# Patient Record
Sex: Male | Born: 1970 | Race: White | Hispanic: No | Marital: Single | Smoking: Current every day smoker
Health system: Southern US, Community
[De-identification: ages and names within clinical notes are randomized; demographics above are authoritative.]

## PROBLEM LIST (undated history)

## (undated) DIAGNOSIS — E7800 Pure hypercholesterolemia, unspecified: Secondary | ICD-10-CM

## (undated) DIAGNOSIS — R569 Unspecified convulsions: Secondary | ICD-10-CM

## (undated) DIAGNOSIS — N200 Calculus of kidney: Secondary | ICD-10-CM

## (undated) DIAGNOSIS — I10 Essential (primary) hypertension: Secondary | ICD-10-CM

---

## 2021-04-10 ENCOUNTER — Encounter (HOSPITAL_COMMUNITY): Payer: Self-pay

## 2021-04-10 ENCOUNTER — Emergency Department (HOSPITAL_COMMUNITY)

## 2021-04-10 ENCOUNTER — Emergency Department (HOSPITAL_COMMUNITY)
Admission: EM | Admit: 2021-04-10 | Discharge: 2021-04-10 | Disposition: A | Discharge status: Home / Self Care | Attending: Emergency Medicine | Admitting: Emergency Medicine

## 2021-04-10 DIAGNOSIS — Z79899 Other long term (current) drug therapy: Secondary | ICD-10-CM | POA: Diagnosis not present

## 2021-04-10 DIAGNOSIS — R569 Unspecified convulsions: Secondary | ICD-10-CM | POA: Diagnosis present

## 2021-04-10 DIAGNOSIS — F17210 Nicotine dependence, cigarettes, uncomplicated: Secondary | ICD-10-CM | POA: Insufficient documentation

## 2021-04-10 DIAGNOSIS — I10 Essential (primary) hypertension: Secondary | ICD-10-CM | POA: Diagnosis not present

## 2021-04-10 HISTORY — DX: Unspecified convulsions: R56.9

## 2021-04-10 HISTORY — DX: Essential (primary) hypertension: I10

## 2021-04-10 HISTORY — DX: Calculus of kidney: N20.0

## 2021-04-10 LAB — COMPREHENSIVE METABOLIC PANEL
ALT: 40 U/L (ref 0–44)
AST: 24 U/L (ref 15–41)
Albumin: 4.1 g/dL (ref 3.5–5.0)
Alkaline Phosphatase: 79 U/L (ref 38–126)
Anion gap: 6 (ref 5–15)
BUN: 25 mg/dL — ABNORMAL HIGH (ref 6–20)
CO2: 25 mmol/L (ref 22–32)
Calcium: 9.2 mg/dL (ref 8.9–10.3)
Chloride: 107 mmol/L (ref 98–111)
Creatinine, Ser: 0.9 mg/dL (ref 0.61–1.24)
GFR, Estimated: >60 mL/min (ref >60)
Glucose, Bld: 91 mg/dL (ref 70–99)
Potassium: 4 mmol/L (ref 3.5–5.1)
Sodium: 138 mmol/L (ref 135–145)
Total Bilirubin: 0.6 mg/dL (ref 0.3–1.2)
Total Protein: 7.1 g/dL (ref 6.5–8.1)

## 2021-04-10 LAB — CBC WITH DIFFERENTIAL/PLATELET
Abs Immature Granulocytes: 0.03 10*3/uL (ref 0.00–0.07)
Basophils Absolute: 0 10*3/uL (ref 0.0–0.1)
Basophils Relative: 0 %
Eosinophils Absolute: 0.2 10*3/uL (ref 0.0–0.5)
Eosinophils Relative: 2 %
HCT: 45.3 % (ref 39.0–52.0)
Hemoglobin: 15.1 g/dL (ref 13.0–17.0)
Immature Granulocytes: 0 %
Lymphocytes Relative: 24 %
Lymphs Abs: 2 10*3/uL (ref 0.7–4.0)
MCH: 30 pg (ref 26.0–34.0)
MCHC: 33.3 g/dL (ref 30.0–36.0)
MCV: 89.9 fL (ref 80.0–100.0)
Monocytes Absolute: 0.5 10*3/uL (ref 0.1–1.0)
Monocytes Relative: 6 %
Neutro Abs: 5.7 10*3/uL (ref 1.7–7.7)
Neutrophils Relative %: 68 %
Platelets: 290 10*3/uL (ref 150–400)
RBC: 5.04 MIL/uL (ref 4.22–5.81)
RDW: 12.3 % (ref 11.5–15.5)
WBC: 8.4 10*3/uL (ref 4.0–10.5)
nRBC: 0 % (ref 0.0–0.2)

## 2021-04-10 LAB — CBG MONITORING, ED: Glucose-Capillary: 101 mg/dL — ABNORMAL HIGH (ref 70–99)

## 2021-04-10 MED ORDER — ACETAMINOPHEN 325 MG PO TABS
650.0000 mg | ORAL_TABLET | Freq: Once | ORAL | Status: AC
  Administered 2021-04-10: 650 mg via ORAL
  Filled 2021-04-10: qty 2

## 2021-04-10 MED ORDER — LEVETIRACETAM IN NACL 1000 MG/100ML IV SOLN
1000.0000 mg | Freq: Once | INTRAVENOUS | Status: AC
  Administered 2021-04-10: 1000 mg via INTRAVENOUS
  Filled 2021-04-10: qty 100

## 2021-04-10 NOTE — Discharge Instructions (Addendum)
Continue taking the Keppra 750 mg twice a day.  Follow-up with Dr. Gerilyn Pilgrim in the next 2 to 3 weeks.  Return if any more problems

## 2021-04-10 NOTE — ED Provider Notes (Signed)
[facility] EMERGENCY DEPARTMENT Provider Note   CSN: [id number] Arrival date & time: 04/10/21  1215     History Chief Complaint  Patient presents with   Seizures    Leslie Langille is a 50 y.o. male.  Patient states he has a history of seizures but has not been on his medicine since February.  Patient is incarcerated in a prison [place] here.  Patient had a seizure yesterday and they started him back on his Keppra 750 twice a day.  Then he had another seizure today.  Patient feels back to his normal now  The history is provided by the patient, medical records and the police. No language interpreter was used.  Seizures Seizure activity on arrival: yes   Seizure type:  Grand mal Preceding symptoms: no sensation of an aura present   Initial focality:  None Episode characteristics: no abnormal movements   Postictal symptoms: confusion   Return to baseline: yes   Severity:  Moderate Timing:  Once Progression:  Resolved Context: not alcohol withdrawal       Past Medical History:  Diagnosis Date   Hypertension    Kidney stones    Seizures (HCC)     There are no problems to display for this patient.   History reviewed. No pertinent surgical history.     Family History  Problem Relation Age of Onset   Diabetes Mother    Cancer Mother    Cancer Father     Social History   Tobacco Use   Smoking status: Every Day    Packs/day: 1.00    Years: 30.00    Pack years: 30.00    Types: Cigarettes   Smokeless tobacco: Never  Vaping Use   Vaping Use: Never used  Substance Use Topics   Alcohol use: Not Currently   Drug use: Not Currently    Home Medications Prior to Admission medications   Medication Sig Start Date End Date Taking? Authorizing Provider  levETIRAcetam (KEPPRA) 500 MG tablet Take 500 mg by mouth 2 (two) times daily. 04/09/21   [provider]    Allergies    Dilantin [phenytoin] and Naproxen  Review of Systems   Review of Systems   Constitutional:  Negative for appetite change and fatigue.  HENT:  Negative for congestion, ear discharge and sinus pressure.   Eyes:  Negative for discharge.  Respiratory:  Negative for cough.   Cardiovascular:  Negative for chest pain.  Gastrointestinal:  Negative for abdominal pain and diarrhea.  Genitourinary:  Negative for frequency and hematuria.  Musculoskeletal:  Negative for back pain.  Skin:  Negative for rash.  Neurological:  Positive for seizures. Negative for headaches.  Psychiatric/Behavioral:  Negative for hallucinations.    Physical Exam Updated Vital Signs BP 124/83   Pulse (!) 57   Temp 97.9 F (36.6 C) (Oral)   Resp 19   Ht 5\' 8"  (1.727 m)   Wt 77.1 kg   SpO2 97%   BMI 25.85 kg/m   Physical Exam Vitals and nursing note reviewed.  Constitutional:      Appearance: He is well-developed.  HENT:     Head: Normocephalic.     Nose: Nose normal.  Eyes:     General: No scleral icterus.    Conjunctiva/sclera: Conjunctivae normal.  Neck:     Thyroid: No thyromegaly.  Cardiovascular:     Rate and Rhythm: Normal rate and regular rhythm.     Heart sounds: No murmur heard.   No friction  rub. No gallop.  Pulmonary:     Breath sounds: No stridor. No wheezing or rales.  Chest:     Chest wall: No tenderness.  Abdominal:     General: There is no distension.     Tenderness: There is no abdominal tenderness. There is no rebound.  Musculoskeletal:        General: Normal range of motion.     Cervical back: Neck supple.  Lymphadenopathy:     Cervical: No cervical adenopathy.  Skin:    Findings: No erythema or rash.  Neurological:     Mental Status: He is alert and oriented to person, place, and time.     Motor: No abnormal muscle tone.     Coordination: Coordination normal.  Psychiatric:        Behavior: Behavior normal.    ED Results / Procedures / Treatments   Labs (all labs ordered are listed, but only abnormal results are displayed) Labs Reviewed   COMPREHENSIVE METABOLIC PANEL - Abnormal; Notable for the following components:      Result Value   BUN 25 (*)    All other components within normal limits  CBG MONITORING, ED - Abnormal; Notable for the following components:   Glucose-Capillary 101 (*)    All other components within normal limits  CBC WITH DIFFERENTIAL/PLATELET    EKG None  Radiology CT HEAD WO CONTRAST ( )  Result Date: 04/10/2021 CLINICAL DATA:  Seizure weakness EXAM: CT HEAD WITHOUT CONTRAST TECHNIQUE: Contiguous axial images were obtained from the base of the skull through the vertex without intravenous contrast. COMPARISON:  None. FINDINGS: Brain: There is no evidence of acute intracranial hemorrhage, extra-axial fluid collection, or infarct. The ventricles are not enlarged. There is no midline shift. No mass lesion is identified. Vascular: No hyperdense vessel or unexpected calcification. Skull: Normal. Negative for fracture or focal lesion. Sinuses/Orbits: There is mild mucosal thickening in the imaged paranasal sinuses. The globes and orbits are unremarkable. Other: The mastoid air cells are clear. IMPRESSION: No acute intracranial pathology or epileptogenic focus identified. Electronically Signed   By: Lesia Hausen M.D.   On: 04/10/2021 14:26    Procedures Procedures   Medications Ordered in ED Medications  levETIRAcetam (KEPPRA) IVPB 1000 mg/100 mL premix (0 mg Intravenous Stopped 04/10/21 1315)  acetaminophen (TYLENOL) tablet 650 mg (650 mg Oral Given 04/10/21 1441)    ED Course  I have reviewed the triage vital signs and the nursing notes.  Pertinent labs & imaging results that were available during my care of the patient were reviewed by me and considered in my medical decision making (see chart for details).    MDM Rules/Calculators/A&P                           Patient's labs and CT are unremarkable.  Patient with epilepsy.  He will be given IV load of Keppra 1 g and will follow back up with Dr.  Gerilyn Pilgrim.  I spoke with neurology and they did not feel any further work-up was needed right now.  They also agreed with the gram of Keppra and 750 of Keppra twice a day Final Clinical Impression(s) / ED Diagnoses Final diagnoses:  Seizure [facility])    Rx / DC Orders ED Discharge Orders     None        Bethann Berkshire, MD 04/11/21 [phone]

## 2021-04-10 NOTE — ED Triage Notes (Signed)
Pt. States they had a seizure last night and was given Keppra last night. Pt. Also took Keppra today but pt. Stated they had another seizure around 10:45 this morning.

## 2021-04-10 NOTE — ED Notes (Signed)
Pt bought in by jail. They are at pt bedside

## 2021-04-10 NOTE — ED Notes (Signed)
Water given to officers at bedside

## 2021-04-10 NOTE — Plan of Care (Signed)
Received a call from Dr. Estell Harpin regarding this patient.  Reports that the patient had a seizure in the prison yesterday, was started on Keppra 750 twice daily, had another seizure and brought into ER for evaluation.  Currently back to baseline.  Known history of seizures but was off of medications for multiple months-possibly due to being incarcerated.  Unknown if he has a neurologist. Question was if he needs additional antiepileptics.  Based on limited information and no examination-a load of Keppra 1 g IV right now followed by continuing Keppra 750 twice daily should suffice if that has worked for him in the past.  He should see an outpatient neurologist as soon as possible to establish care and management of seizures.  Also outpatient records should be obtained if he has prior notes from neurologists elsewhere.  -- Milon Dikes, MD Neurologist [facility] Pager: [phone]

## 2021-08-05 ENCOUNTER — Emergency Department (HOSPITAL_COMMUNITY)
Admission: EM | Admit: 2021-08-05 | Discharge: 2021-08-05 | Disposition: A | Discharge status: Home / Self Care | Attending: Emergency Medicine | Admitting: Emergency Medicine

## 2021-08-05 ENCOUNTER — Other Ambulatory Visit: Payer: Self-pay

## 2021-08-05 DIAGNOSIS — I10 Essential (primary) hypertension: Secondary | ICD-10-CM | POA: Diagnosis not present

## 2021-08-05 DIAGNOSIS — R569 Unspecified convulsions: Secondary | ICD-10-CM

## 2021-08-05 DIAGNOSIS — G40909 Epilepsy, unspecified, not intractable, without status epilepticus: Secondary | ICD-10-CM | POA: Diagnosis not present

## 2021-08-05 DIAGNOSIS — F17210 Nicotine dependence, cigarettes, uncomplicated: Secondary | ICD-10-CM | POA: Diagnosis not present

## 2021-08-05 NOTE — ED Triage Notes (Signed)
Pt BIB EMS coming from [facility], was dcd from [facility] yesterday and has been having seizures since yesterday while at [facility]. Per officer at the bedside who was also with the patient yesterday, provider at [facility] stated "nothing's wrong with his brain, he needs to see a psychiatrist/therapist". Had 2 seizures today, lasting 30 sec each. Seizure like activity noted by EMS en route, non postictal per EMS, Versed 2.5mg  given.   135/92 HR 83 99% RA CBG 122

## 2021-08-05 NOTE — ED Notes (Signed)
Pt started shaking, witnessed by this RN lasted about 5 seconds. Pt then sit up straight quickly, asking for paper towel as he started coughing, non postictal.

## 2021-08-05 NOTE — ED Notes (Signed)
Nurse Pickeral given report. States that she will be sending patient back to ER every time he has a seizure.

## 2021-08-05 NOTE — Discharge Instructions (Addendum)
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  The seizures are not because of an electrical storm of the brain as an epilepsy.  Therefore the management of these episodes include providing attention to physiologic and psychogenic causes.  Please read the instructions provided.  We recommend that Joe Collins sees his outpatient neurologist and outpatient psychiatrist. If [facility] did not give him referral to a neurologist, then please follow-up with Dr. Gerilyn Pilgrim, neurologist with [facility] in [place].  If there are repeat episodes, ensure that there is no harmful objects around the store [employer] that could cause injury.  Also make sure that he is breathing well and not turning blue.

## 2021-08-05 NOTE — ED Provider Notes (Signed)
[facility] EMERGENCY DEPARTMENT Provider Note   CSN: [id number] Arrival date & time: 08/05/21  1018     History Chief Complaint  Patient presents with   Seizures    Joe Collins is a 50 y.o. male.  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  Patient comes with the present.  He was just admitted to Dr Joe Collins for similar symptoms earlier this week.  According to the prison guard, patient had 2 episodes of nonepileptic seizures that they witnessed.  Patient is finger start twitching, followed by eyes rolling back and then generalized tonic-clonic activity.  There is no prolonged postictal phase.  No incontinence.  Patient has been getting his medication as prescribed.  He denies any recent illnesses and denies any substance use.  He does not think he is under extra stress.  Past Medical History:  Diagnosis Date   Hypertension    Kidney stones    Seizures (HCC)     There are no problems to display for this patient.   No past surgical history on file.     Family History  Problem Relation Age of Onset   Diabetes Mother    Cancer Mother    Cancer Father     Social History   Tobacco Use   Smoking status: Every Day    Packs/day: 1.00    Years: 30.00    Pack years: 30.00    Types: Cigarettes   Smokeless tobacco: Never  Vaping Use   Vaping Use: Never used  Substance Use Topics   Alcohol use: Not Currently   Drug use: Not Currently    Home Medications Prior to Admission medications   Medication Sig Start Date End Date Taking? Authorizing Provider  levETIRAcetam (KEPPRA) 500 MG tablet Take 1,500 mg by mouth 2 (two) times daily. 04/09/21  Yes [provider]    Allergies    Dilantin [phenytoin], Naproxen, and Valproic acid  Review of Systems   Review of Systems  Constitutional:  Positive for activity change.  Eyes:  Negative for visual disturbance.  Respiratory:   Negative for shortness of breath.   Cardiovascular:  Negative for chest pain.  Gastrointestinal:  Negative for nausea and vomiting.  Allergic/Immunologic: Negative for immunocompromised state.  Neurological:  Positive for seizures. Negative for headaches.  Hematological:  Does not bruise/bleed easily.  All other systems reviewed and are negative.  Physical Exam Updated Vital Signs BP 119/83    Pulse 73    Temp 98.2 F (36.8 C) (Oral)    Resp 10    Ht 5\' 8"  (1.727 m)    Wt 77.1 kg    SpO2 99%    BMI 25.85 kg/m   Physical Exam Vitals and nursing note reviewed.  Constitutional:      Appearance: He is well-developed.  HENT:     Head: Atraumatic.  Eyes:     Extraocular Movements: Extraocular movements intact.     Pupils: Pupils are equal, round, and reactive to light.  Cardiovascular:     Rate and Rhythm: Normal rate.  Pulmonary:     Effort: Pulmonary effort is normal.  Musculoskeletal:     Cervical back: Neck supple.  Skin:    General: Skin is warm.  Neurological:     Mental Status: He is alert and oriented to person, place, and time.    ED Results / Procedures / Treatments   Labs (all labs ordered are listed, but  only abnormal results are displayed) Labs Reviewed - No data to display  EKG None  Radiology No results found.  Procedures Procedures   Medications Ordered in ED Medications - No data to display  ED Course  I have reviewed the triage vital signs and the nursing notes.  Pertinent labs & imaging results that were available during my care of the patient were reviewed by me and considered in my medical decision making (see chart for details).    MDM Rules/Calculators/A&P  50 year old comes in with chief complaint of seizure-like activity.  I reviewed patient's records, and it appears that he is having nonepileptic seizures.   We have observed patient in the ED for 4 hours and there has not been any episodes witnessed after the initial 1 at 10:40 AM.   The nursing staff witnessed the episode, and it appeared like a nonorganic episode to them since there was no postictal phase or any incontinence.  No labs indicated.  3:03 PM The patient appears reasonably screened and/or stabilized for discharge and I doubt any other medical condition or other [facility] requiring further screening, evaluation, or treatment in the ED at this time prior to discharge.   Results from the ER workup discussed with the patient face to face and all questions answered to the best of my ability. The patient is safe for discharge with strict return precautions.      Final Clinical Impression(s) / ED Diagnoses Final diagnoses:  Nonepileptic episode [facility])    Rx / DC Orders ED Discharge Orders     None        Derwood Kaplan, MD 08/05/21 1503

## 2021-11-20 ENCOUNTER — Encounter (HOSPITAL_COMMUNITY): Payer: Self-pay

## 2021-11-20 ENCOUNTER — Other Ambulatory Visit: Payer: Self-pay

## 2021-11-20 ENCOUNTER — Emergency Department (HOSPITAL_COMMUNITY)

## 2021-11-20 ENCOUNTER — Emergency Department (HOSPITAL_COMMUNITY)
Admission: EM | Admit: 2021-11-20 | Discharge: 2021-11-20 | Disposition: A | Discharge status: Home / Self Care | Source: Home / Self Care | Attending: Emergency Medicine | Admitting: Emergency Medicine

## 2021-11-20 ENCOUNTER — Emergency Department (HOSPITAL_COMMUNITY)
Admission: EM | Admit: 2021-11-20 | Discharge: 2021-11-20 | Disposition: A | Discharge status: Home / Self Care | Attending: Emergency Medicine | Admitting: Emergency Medicine

## 2021-11-20 ENCOUNTER — Encounter (HOSPITAL_COMMUNITY): Payer: Self-pay | Admitting: *Deleted

## 2021-11-20 DIAGNOSIS — M5030 Other cervical disc degeneration, unspecified cervical region: Secondary | ICD-10-CM | POA: Diagnosis not present

## 2021-11-20 DIAGNOSIS — R569 Unspecified convulsions: Secondary | ICD-10-CM | POA: Diagnosis present

## 2021-11-20 DIAGNOSIS — R519 Headache, unspecified: Secondary | ICD-10-CM | POA: Diagnosis not present

## 2021-11-20 DIAGNOSIS — R030 Elevated blood-pressure reading, without diagnosis of hypertension: Secondary | ICD-10-CM | POA: Insufficient documentation

## 2021-11-20 DIAGNOSIS — M791 Myalgia, unspecified site: Secondary | ICD-10-CM | POA: Diagnosis not present

## 2021-11-20 DIAGNOSIS — I10 Essential (primary) hypertension: Secondary | ICD-10-CM | POA: Diagnosis not present

## 2021-11-20 DIAGNOSIS — F445 Conversion disorder with seizures or convulsions: Secondary | ICD-10-CM | POA: Insufficient documentation

## 2021-11-20 DIAGNOSIS — Z79899 Other long term (current) drug therapy: Secondary | ICD-10-CM | POA: Insufficient documentation

## 2021-11-20 DIAGNOSIS — G40909 Epilepsy, unspecified, not intractable, without status epilepticus: Secondary | ICD-10-CM

## 2021-11-20 HISTORY — DX: Pure hypercholesterolemia, unspecified: E78.00

## 2021-11-20 LAB — URINALYSIS, ROUTINE W REFLEX MICROSCOPIC
Bacteria, UA: NONE SEEN
Bilirubin Urine: NEGATIVE
Glucose, UA: NEGATIVE mg/dL
Ketones, ur: NEGATIVE mg/dL
Leukocytes,Ua: NEGATIVE
Nitrite: NEGATIVE
Protein, ur: NEGATIVE mg/dL
Specific Gravity, Urine: 1.008 (ref 1.005–1.030)
pH: 7 (ref 5.0–8.0)

## 2021-11-20 LAB — COMPREHENSIVE METABOLIC PANEL
ALT: 36 U/L (ref 0–44)
AST: 20 U/L (ref 15–41)
Albumin: 4.4 g/dL (ref 3.5–5.0)
Alkaline Phosphatase: 75 U/L (ref 38–126)
Anion gap: 10 (ref 5–15)
BUN: 16 mg/dL (ref 6–20)
CO2: 24 mmol/L (ref 22–32)
Calcium: 9.4 mg/dL (ref 8.9–10.3)
Chloride: 105 mmol/L (ref 98–111)
Creatinine, Ser: 0.87 mg/dL (ref 0.61–1.24)
GFR, Estimated: >60 mL/min (ref >60)
Glucose, Bld: 100 mg/dL — ABNORMAL HIGH (ref 70–99)
Potassium: 4.1 mmol/L (ref 3.5–5.1)
Sodium: 139 mmol/L (ref 135–145)
Total Bilirubin: 0.5 mg/dL (ref 0.3–1.2)
Total Protein: 7.9 g/dL (ref 6.5–8.1)

## 2021-11-20 LAB — CBC WITH DIFFERENTIAL/PLATELET
Abs Immature Granulocytes: 0.02 10*3/uL (ref 0.00–0.07)
Basophils Absolute: 0 10*3/uL (ref 0.0–0.1)
Basophils Relative: 0 %
Eosinophils Absolute: 0.1 10*3/uL (ref 0.0–0.5)
Eosinophils Relative: 2 %
HCT: 46.7 % (ref 39.0–52.0)
Hemoglobin: 15.5 g/dL (ref 13.0–17.0)
Immature Granulocytes: 0 %
Lymphocytes Relative: 22 %
Lymphs Abs: 1.6 10*3/uL (ref 0.7–4.0)
MCH: 28.4 pg (ref 26.0–34.0)
MCHC: 33.2 g/dL (ref 30.0–36.0)
MCV: 85.7 fL (ref 80.0–100.0)
Monocytes Absolute: 0.5 10*3/uL (ref 0.1–1.0)
Monocytes Relative: 7 %
Neutro Abs: 5 10*3/uL (ref 1.7–7.7)
Neutrophils Relative %: 69 %
Platelets: 249 10*3/uL (ref 150–400)
RBC: 5.45 MIL/uL (ref 4.22–5.81)
RDW: 12.8 % (ref 11.5–15.5)
WBC: 7.2 10*3/uL (ref 4.0–10.5)
nRBC: 0 % (ref 0.0–0.2)

## 2021-11-20 LAB — RAPID URINE DRUG SCREEN, HOSP PERFORMED
Amphetamines: NOT DETECTED
Barbiturates: NOT DETECTED
Benzodiazepines: NOT DETECTED
Cocaine: NOT DETECTED
Opiates: NOT DETECTED
Tetrahydrocannabinol: NOT DETECTED

## 2021-11-20 LAB — CK: Total CK: 60 U/L (ref 49–397)

## 2021-11-20 LAB — CBG MONITORING, ED: Glucose-Capillary: 98 mg/dL (ref 70–99)

## 2021-11-20 MED ORDER — ACETAMINOPHEN 500 MG PO TABS
1000.0000 mg | ORAL_TABLET | Freq: Once | ORAL | Status: AC
  Administered 2021-11-20: 1000 mg via ORAL
  Filled 2021-11-20: qty 2

## 2021-11-20 MED ORDER — LORAZEPAM 2 MG/ML IJ SOLN
INTRAMUSCULAR | Status: AC
  Filled 2021-11-20: qty 1

## 2021-11-20 MED ORDER — LORAZEPAM 2 MG/ML IJ SOLN
1.0000 mg | Freq: Once | INTRAMUSCULAR | Status: AC
  Administered 2021-11-20: 1 mg via INTRAVENOUS
  Filled 2021-11-20: qty 1

## 2021-11-20 NOTE — ED Notes (Signed)
Pt ambulated around ed, no complaints  ?

## 2021-11-20 NOTE — ED Notes (Signed)
Spoke with physician from [facility] facility. Physician was given last set of vital signs.  ?

## 2021-11-20 NOTE — ED Notes (Signed)
Cbg  98 ?

## 2021-11-20 NOTE — ED Provider Notes (Addendum)
[facility] EMERGENCY DEPARTMENT Provider Note   CSN: [id number] Arrival date & time: 11/20/21  2031     History  Chief Complaint  Patient presents with   Seizures    Joe Collins is a 51 y.o. male.  Patient presents via jail with possible seizure. ?report 4 seizures at jail.  Patient currently alert, no distress. Not post-ictal, alert, oriented. Pt indicates compliant w meds, denies change in meds or doses. Of note, pt admitted to [facility] 07/2021 for possible seizures, was noted during clinical eval, and on video eed monitoring to have events that were non-epileptic/functional events. Pt also w ED visit(s) during which appeared  to have pseudoseizure/non-epileptic event.  Pt denies headache. No neck/back pain. No fevers. No abd pain or nv. No numbness/weakness. No change in speech or vision.  W events, no post-ictal period, no incontinence, no oral injury.   The history is provided by the patient and medical records [facility]).  Seizures     Home Medications Prior to Admission medications   Medication Sig Start Date End Date Taking? Authorizing Provider  amLODipine (NORVASC) 10 MG tablet Take 10 mg by mouth daily.    [provider]  hydrochlorothiazide (HYDRODIURIL) 25 MG tablet Take 25 mg by mouth daily.    [provider]  ibuprofen (ADVIL) 200 MG tablet Take 200 mg by mouth every 6 (six) hours as needed for fever, mild pain or headache.    [provider]  levETIRAcetam (KEPPRA) 500 MG tablet Take 2,000 mg by mouth 2 (two) times daily. 04/09/21   [provider]      Allergies    Fish-derived products, Dilantin [phenytoin], Naproxen, and Valproic acid    Review of Systems   Review of Systems  Constitutional:  Negative for fever.  HENT:  Negative for sore throat.   Eyes:  Negative for redness.  Respiratory:  Negative for shortness of breath.   Cardiovascular:  Negative for chest pain.  Gastrointestinal:  Negative for abdominal pain.   Genitourinary:  Negative for dysuria.  Musculoskeletal:  Negative for back pain and neck pain.  Skin:  Negative for wound.  Neurological:  Negative for headaches.  Hematological:  Does not bruise/bleed easily.  Psychiatric/Behavioral:  Negative for confusion.    Physical Exam Updated Vital Signs BP (!) 153/96   Pulse 64   Temp (!) 97.5 F (36.4 C)   Resp 16   SpO2 100%  Physical Exam Vitals and nursing note reviewed.  Constitutional:      Appearance: Normal appearance. He is well-developed.  HENT:     Head: Atraumatic.     Nose: Nose normal.     Mouth/Throat:     Mouth: Mucous membranes are moist.     Pharynx: Oropharynx is clear.     Comments: No oral or tongue trauma.  Eyes:     General: No scleral icterus.    Conjunctiva/sclera: Conjunctivae normal.     Pupils: Pupils are equal, round, and reactive to light.  Neck:     Vascular: No carotid bruit.     Trachea: No tracheal deviation.     Comments: No stiffness or rigidity.  Cardiovascular:     Rate and Rhythm: Normal rate and regular rhythm.     Pulses: Normal pulses.     Heart sounds: Normal heart sounds. No murmur heard.   No friction rub. No gallop.  Pulmonary:     Effort: Pulmonary effort is normal. No accessory muscle usage or respiratory distress.  Breath sounds: Normal breath sounds.  Abdominal:     General: Bowel sounds are normal. There is no distension.     Palpations: Abdomen is soft.     Tenderness: There is no abdominal tenderness.  Genitourinary:    Comments: No cva tenderness. Musculoskeletal:        General: No swelling.     Cervical back: Normal range of motion and neck supple. No rigidity.     Comments: C spine non tender, normal movement. No focal extremity pain or swelling.   Skin:    General: Skin is warm and dry.     Findings: No rash.  Neurological:     Mental Status: He is alert.     Comments: Alert, speech clear. Motor/sens grossly intact bil. No seizure activity observed.    Psychiatric:        Mood and Affect: Mood normal.    ED Results / Procedures / Treatments   Labs (all labs ordered are listed, but only abnormal results are displayed) Labs Reviewed - No data to display  EKG None  Radiology CT Head Wo Contrast  Result Date: 11/20/2021 CLINICAL DATA:  Headache, new or worsening (Age >= 50y). Recurrent seizures reported this morning. EXAM: CT HEAD WITHOUT CONTRAST TECHNIQUE: Contiguous axial images were obtained from the base of the skull through the vertex without intravenous contrast. RADIATION DOSE REDUCTION: This exam was performed according to the departmental dose-optimization program which includes automated exposure control, adjustment of the mA and/or kV according to patient size and/or use of iterative reconstruction technique. COMPARISON:  04/10/2021 head CT. FINDINGS: Brain: No evidence of parenchymal hemorrhage or extra-axial fluid collection. No mass lesion, mass effect, or midline shift. No CT evidence of acute infarction. Cerebral volume is age appropriate. No ventriculomegaly. Vascular: No acute abnormality. Skull: No evidence of calvarial fracture. Sinuses/Orbits: The visualized paranasal sinuses are essentially clear. Other:  The mastoid air cells are unopacified. IMPRESSION: Negative head CT. No evidence of acute intracranial abnormality. Electronically Signed   By: Delbert Phenix M.D.   On: 11/20/2021 12:09   CT Cervical Spine Wo Contrast  Result Date: 11/20/2021 CLINICAL DATA:  Neck trauma, focal neuro deficit or paresthesia (Age 14-64y). Recurrent seizures this morning. EXAM: CT CERVICAL SPINE WITHOUT CONTRAST TECHNIQUE: Multidetector CT imaging of the cervical spine was performed without intravenous contrast. Multiplanar CT image reconstructions were also generated. RADIATION DOSE REDUCTION: This exam was performed according to the departmental dose-optimization program which includes automated exposure control, adjustment of the mA and/or kV  according to patient size and/or use of iterative reconstruction technique. COMPARISON:  None. FINDINGS: Alignment: Straightening of the cervical spine. No facet subluxation. Dens is well positioned between the lateral masses of C1. Skull base and vertebrae: No acute fracture. No primary bone lesion or focal pathologic process. Soft tissues and spinal canal: No prevertebral edema. No visible canal hematoma. Disc levels: Mild multilevel cervical degenerative disc disease, most prominent at C5-6. Mild-to-moderate facet arthropathy, asymmetric to the left. No significant degenerative foraminal stenosis. Upper chest: No acute abnormality. Other: Visualized mastoid air cells appear clear. No discrete thyroid nodules. No pathologically enlarged cervical nodes. IMPRESSION: 1. No cervical spine fracture or subluxation. 2. Mild-to-moderate multilevel cervical degenerative disc disease and facet arthropathy. Electronically Signed   By: Delbert Phenix M.D.   On: 11/20/2021 12:16    Procedures Procedures    Medications Ordered in ED Medications - No data to display  ED Course/ Medical Decision Making/ A&P  Medical Decision Making Problems Addressed: Elevated blood pressure reading: acute illness or injury Psychiatric pseudoseizure: chronic illness or injury Seizure-like activity (HCC): chronic illness or injury  Amount and/or Complexity of Data Reviewed External Data Reviewed: radiology and notes. Radiology: independent interpretation performed.   Iv ns. Continuous pulse ox and cardiac monitoring.   Reviewed nursing notes and prior charts for additional history. External reports reviewed. 07/2021 @ [facility], video eeg, non epileptic shaking activity. Additional history from: [facility]  Cardiac monitor: sinus rhythm, rate 66.   Recent labs reviewed/interpreted by me - chem normal.   Recent CT reviewed/interpreted by me - no hem.   From record review, it appears pts shaking events are  not organic, non-epileptic in nature.  No seizure activity in ED. Normal exam.   Pt currently appears stable for d/c.   Rec outpatient pcp/neurology f/u.         Final Clinical Impression(s) / ED Diagnoses Final diagnoses:  None    Rx / DC Orders ED Discharge Orders     None           Cathren Laine, MD 11/20/21 2159

## 2021-11-20 NOTE — ED Notes (Signed)
Patient transported to CT 

## 2021-11-20 NOTE — ED Provider Notes (Signed)
?[facility] ?Provider Note ? ? ?CSN: [id number] ?Arrival date & time: 11/20/21  1104 ? ?  ? ?History ? ?Chief Complaint  ?Patient presents with  ? Seizures  ? ? ?Joe Collins is a 51 y.o. male with a history including non epileptic seizure disorder and hypertension presenting for evaluation of 6 back-to-back but transient seizure episodes this morning.  He is describing having less than a minute episodes of generalized seizure, he describes all over body shaking when this occurs.  He was lying in his bed when these episodes happened, stating each lasted less than a minute. He denies feeling confused after the events, no urinary or fecal incontinence, denies tongue or mouth pain.  He denies any specific injury as he did not fall but he endorses allover body aches and neck pain with tingling in his fingertips since his last seizure.  [facility].  He is given Keppra 2000 mg twice daily and has had no missed doses.  This Keppra dose was increased approximately 1 month ago which was about the time of his last seizure.  Any recent illnesses, no recent antibiotic use, no fevers, denies head injury, no increased stressors.  He does endorse generalized headache currently. ? ?The history is provided by the patient.  ? ?  ? ?Home Medications ?Prior to Admission medications   ?Medication Sig Start Date End Date Taking? Authorizing Provider  ?amLODipine (NORVASC) 10 MG tablet Take 10 mg by mouth daily.   Yes [provider]  ?hydrochlorothiazide (HYDRODIURIL) 25 MG tablet Take 25 mg by mouth daily.   Yes [provider]  ?ibuprofen (ADVIL) 200 MG tablet Take 200 mg by mouth every 6 (six) hours as needed for fever, mild pain or headache.   Yes [provider]  ?levETIRAcetam (KEPPRA) 500 MG tablet Take 2,000 mg by mouth 2 (two) times daily. 04/09/21  Yes [provider]  ?   ? ?Allergies    ?Fish-derived products, Dilantin [phenytoin], Naproxen, and  Valproic acid   ? ?Review of Systems   ?Review of Systems  ?Constitutional:  Negative for chills and fever.  ?HENT: Negative.    ?Eyes: Negative.   ?Respiratory:  Negative for chest tightness and shortness of breath.   ?Cardiovascular:  Negative for chest pain.  ?Gastrointestinal:  Negative for abdominal pain, nausea and vomiting.  ?Genitourinary: Negative.   ?Musculoskeletal:  Positive for myalgias and neck pain. Negative for arthralgias and joint swelling.  ?Skin: Negative.  Negative for rash and wound.  ?Neurological:  Positive for seizures and numbness. Negative for dizziness, weakness, light-headedness and headaches.  ?Psychiatric/Behavioral: Negative.    ? ?Physical Exam ?Updated Vital Signs ?BP 114/78   Pulse 71   Temp 97.6 ?F (36.4 ?C) (Oral)   Resp 20   Ht 5\' 8"  (1.727 m)   Wt 72.6 kg   SpO2 94%   BMI 24.33 kg/m?  ?Physical Exam ?Vitals and nursing note reviewed.  ?Constitutional:   ?   Appearance: He is well-developed.  ?HENT:  ?   Head: Normocephalic and atraumatic.  ?Eyes:  ?   Conjunctiva/sclera: Conjunctivae normal.  ?Cardiovascular:  ?   Rate and Rhythm: Normal rate and regular rhythm.  ?   Heart sounds: Normal heart sounds.  ?Pulmonary:  ?   Effort: Pulmonary effort is normal.  ?   Breath sounds: Normal breath sounds. No wheezing.  ?Abdominal:  ?   General: Bowel sounds are normal.  ?   Palpations: Abdomen is  soft.  ?   Tenderness: There is no abdominal tenderness.  ?Musculoskeletal:     ?   General: Normal range of motion.  ?   Cervical back: Normal range of motion.  ?Skin: ?   General: Skin is warm and dry.  ?Neurological:  ?   General: No focal deficit present.  ?   Mental Status: He is alert and oriented to person, place, and time.  ?   Cranial Nerves: No cranial nerve deficit or facial asymmetry.  ?   Sensory: Sensation is intact.  ?   Motor: Motor function is intact. No pronator drift.  ?   Coordination: Rapid alternating movements normal.  ?   Gait: Gait normal.  ? ? ?ED Results /  Procedures / Treatments   ?Labs ?(all labs ordered are listed, but only abnormal results are displayed) ?Labs Reviewed  ?COMPREHENSIVE METABOLIC PANEL - Abnormal; Notable for the following components:  ?    Result Value  ? Glucose, Bld 100 (*)   ? All other components within normal limits  ?URINALYSIS, ROUTINE W REFLEX MICROSCOPIC - Abnormal; Notable for the following components:  ? Color, Urine STRAW (*)   ? Hgb urine dipstick SMALL (*)   ? All other components within normal limits  ?CBC WITH DIFFERENTIAL/PLATELET  ?CK  ?RAPID URINE DRUG SCREEN, HOSP PERFORMED  ?LEVETIRACETAM LEVEL  ?CBG MONITORING, ED  ? ? ?EKG ?None ? ?Radiology ?CT Head Wo Contrast ? ?Result Date: 11/20/2021 ?CLINICAL DATA:  Headache, new or worsening (Age >= 50y). Recurrent seizures reported this morning. EXAM: CT HEAD WITHOUT CONTRAST TECHNIQUE: Contiguous axial images were obtained from the base of the skull through the vertex without intravenous contrast. RADIATION DOSE REDUCTION: This exam was performed according to the departmental dose-optimization program which includes automated exposure control, adjustment of the mA and/or kV according to patient size and/or use of iterative reconstruction technique. COMPARISON:  04/10/2021 head CT. FINDINGS: Brain: No evidence of parenchymal hemorrhage or extra-axial fluid collection. No mass lesion, mass effect, or midline shift. No CT evidence of acute infarction. Cerebral volume is age appropriate. No ventriculomegaly. Vascular: No acute abnormality. Skull: No evidence of calvarial fracture. Sinuses/Orbits: The visualized paranasal sinuses are essentially clear. Other:  The mastoid air cells are unopacified. IMPRESSION: Negative head CT. No evidence of acute intracranial abnormality. Electronically Signed   By: Delbert PhenixJason A Poff M.D.   On: 11/20/2021 12:09  ? ?CT Cervical Spine Wo Contrast ? ?Result Date: 11/20/2021 ?CLINICAL DATA:  Neck trauma, focal neuro deficit or paresthesia (Age 37-64y). Recurrent  seizures this morning. EXAM: CT CERVICAL SPINE WITHOUT CONTRAST TECHNIQUE: Multidetector CT imaging of the cervical spine was performed without intravenous contrast. Multiplanar CT image reconstructions were also generated. RADIATION DOSE REDUCTION: This exam was performed according to the departmental dose-optimization program which includes automated exposure control, adjustment of the mA and/or kV according to patient size and/or use of iterative reconstruction technique. COMPARISON:  None. FINDINGS: Alignment: Straightening of the cervical spine. No facet subluxation. Dens is well positioned between the lateral masses of C1. Skull base and vertebrae: No acute fracture. No primary bone lesion or focal pathologic process. Soft tissues and spinal canal: No prevertebral edema. No visible canal hematoma. Disc levels: Mild multilevel cervical degenerative disc disease, most prominent at C5-6. Mild-to-moderate facet arthropathy, asymmetric to the left. No significant degenerative foraminal stenosis. Upper chest: No acute abnormality. Other: Visualized mastoid air cells appear clear. No discrete thyroid nodules. No pathologically enlarged cervical nodes. IMPRESSION: 1. No cervical spine fracture  or subluxation. 2. Mild-to-moderate multilevel cervical degenerative disc disease and facet arthropathy. Electronically Signed   By: Delbert Phenix M.D.   On: 11/20/2021 12:16   ? ?Procedures ?Procedures  ? ? ?Medications Ordered in ED ?Medications  ?LORazepam (ATIVAN) injection 1 mg (1 mg Intravenous Given 11/20/21 1200)  ?acetaminophen (TYLENOL) tablet 1,000 mg (1,000 mg Oral Given 11/20/21 1318)  ? ? ?ED Course/ Medical Decision Making/ A&P ?  ?                        ?Medical Decision Making ?Patient presenting with history of back to back seizures occurring this morning, none witnessed, with no suggestion of postictal phase including no urinary incontinence, also has no injury, tongue biting etc.  Review of chart indicating that  he is being treated for seizures on Keppra although he has had normal EEGs, most recently during an admission at [facility] and there was some suggestion of pseudoseizure episodes as well.  Today's exam suggest that.  He was se

## 2021-11-20 NOTE — ED Triage Notes (Addendum)
Pt returns from [place] [facility] via ems for recurrent seizure. Had 4 at jail since discharge. Has had night Keppra. Alert and oriented x4.  ?Pt was here earlier today for the same and ws d/c.  ?

## 2021-11-20 NOTE — ED Notes (Signed)
Law enforcement given discharge paperwork. Law enforcement states that they are waiting on a phone call to return back to [facility].  ?

## 2021-11-20 NOTE — Discharge Instructions (Addendum)
It was our pleasure to provide your ER care today - we hope that you feel better. ? ?Drink plenty of fluids/stay well hydrated.  ? ?Please note that in December, 2022, you had evaluation and [facility], including video eeg monitoring - the shaking events noted were not epileptic, not seizures. See attached information re pseudoseizures.  ? ?Follow up with primary doctor/neurologist in the next 1-2 weeks. Your blood pressure is mildly high - follow up with primary care doctor in 1-2 weeks.  ? ?Return to ER if worse, new symptoms, fevers, trouble breathing, or other concern.  ?

## 2021-11-20 NOTE — Discharge Instructions (Signed)
Your lab test today are reassuring and your CT imaging does not show any acute injury although it does appear you have some arthritis and mild degenerative disc changes in your cervical spine.  Continue to to take your current dose of Keppra.  However you need to contact your neurologist if your symptoms do not remain controlled as you may need to have your medicines adjusted.  There is no indication to increase your Keppra today. ?

## 2021-11-20 NOTE — ED Notes (Signed)
Pt began shaking his arms and legs. RN went into room and pt looked at [employer] and began talking. ED PA made aware. ?

## 2021-11-20 NOTE — ED Triage Notes (Signed)
Reported that pt had 6 back to back seizures this morning.  Pt from the prison.  Pt takes Keppra and was given 2000mg  PO this morning.  Pt denies taking any illegal drugs. Reported last seizure was a month ago.  C/o generalized pain ?

## 2021-11-21 LAB — LEVETIRACETAM LEVEL: Levetiracetam Lvl: 43.7 ug/mL — ABNORMAL HIGH (ref 10.0–40.0)

## 2022-12-31 IMAGING — CT CT CERVICAL SPINE W/O CM
3 of 4 series · 13 of 33 positions shown, 16 images · non-contrast
Comparison: None.

CLINICAL DATA: Neck trauma, focal neuro deficit or paresthesia (Age
16-64y). Recurrent seizures this morning.



[Series 5: sagittal bone · sagittal · 0.31mm/px · 5 of 65 slices shown, 6 images]
[im 22/65  bone]
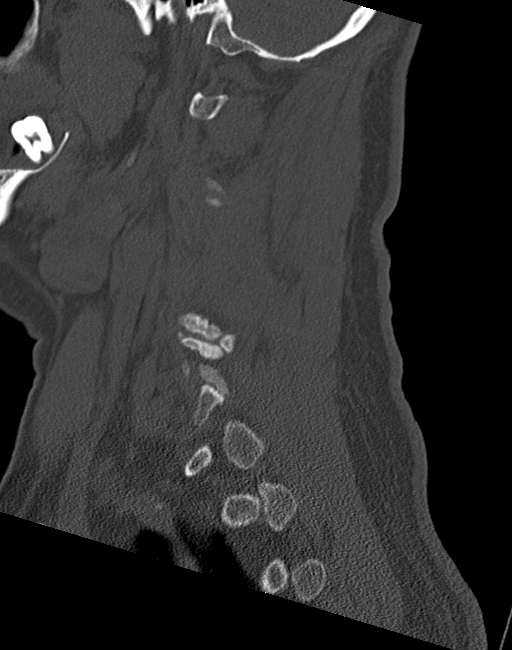
[im 27/65  bone]
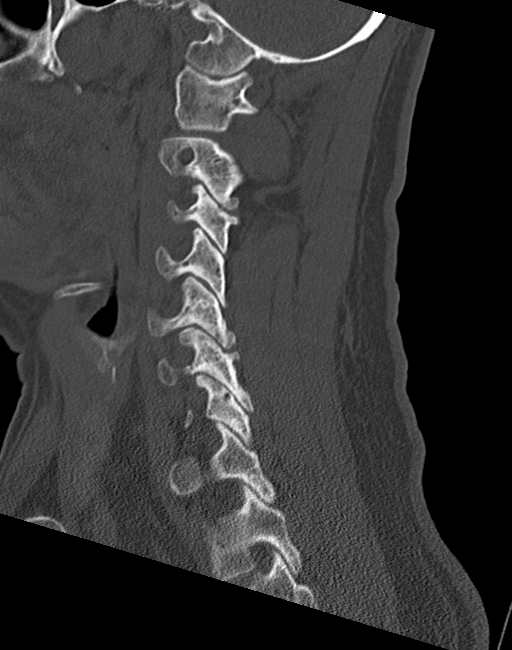
[im 33/65  soft-tissue]
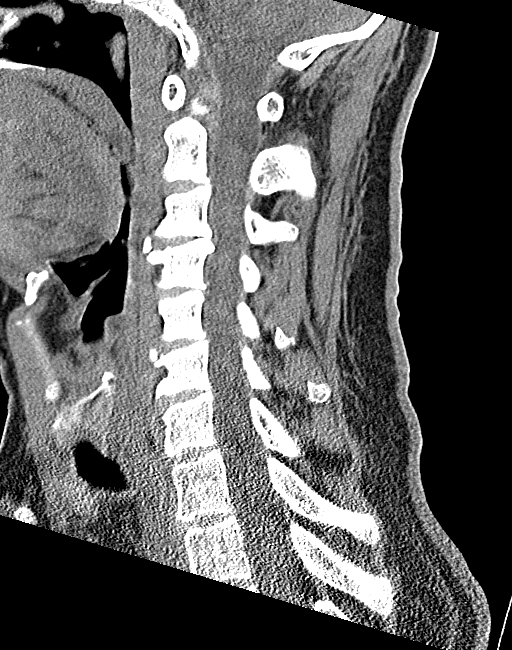
[im 33/65  bone]
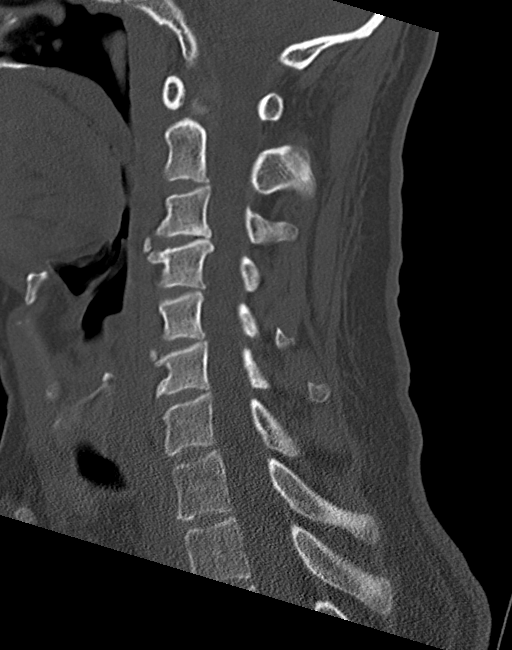
[im 38/65  bone]
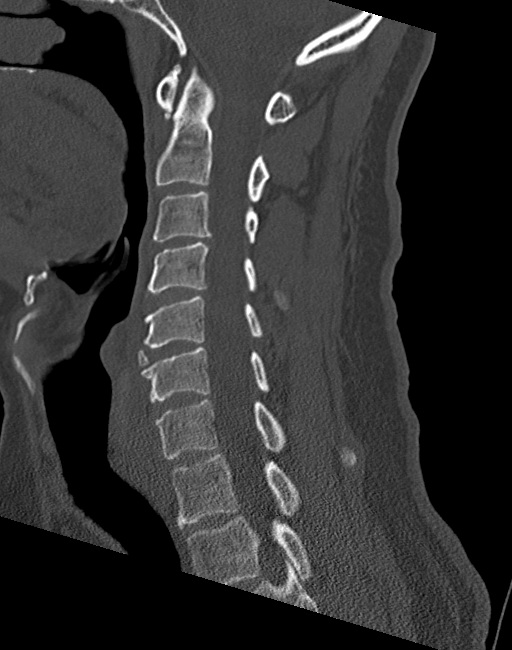
[im 43/65  bone]
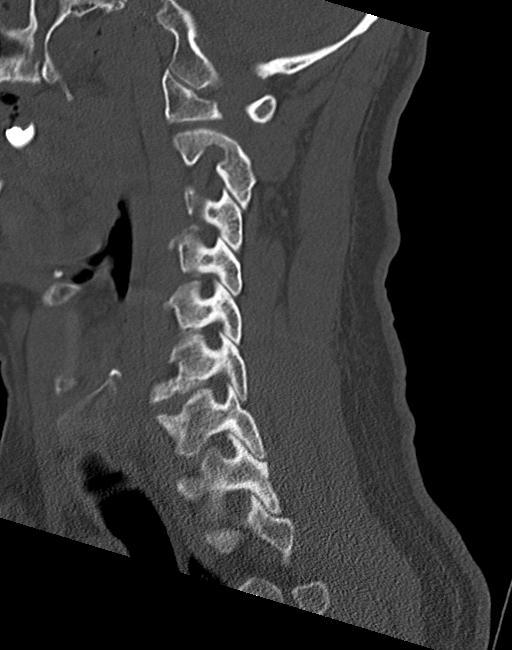

[Series 6: coronal bone · coronal · 0.25mm/px · 3 of 80 slices shown]
[im 16/80  bone]
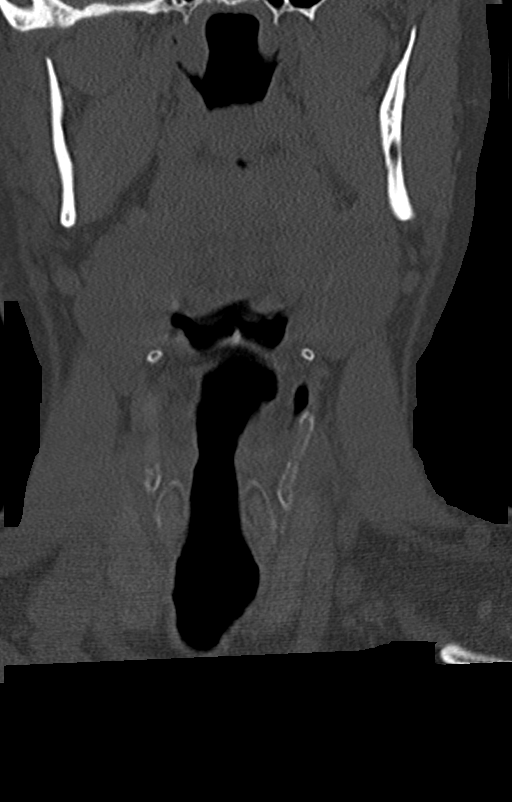
[im 32/80  bone]
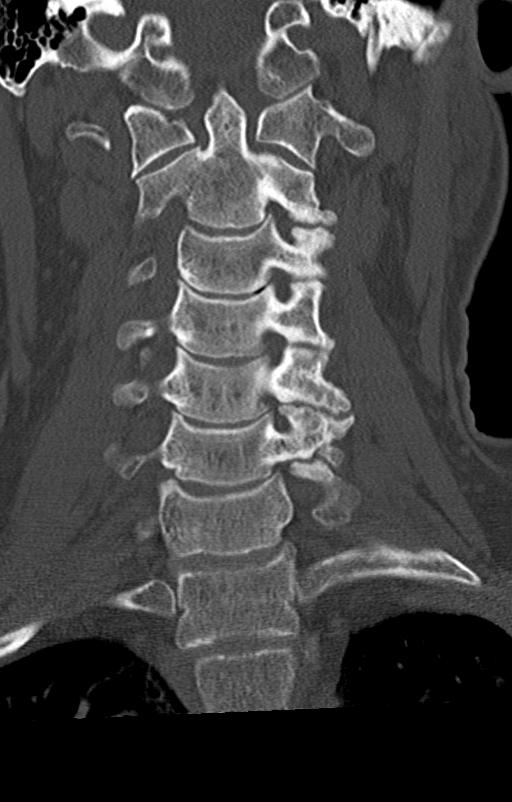
[im 48/80  bone]
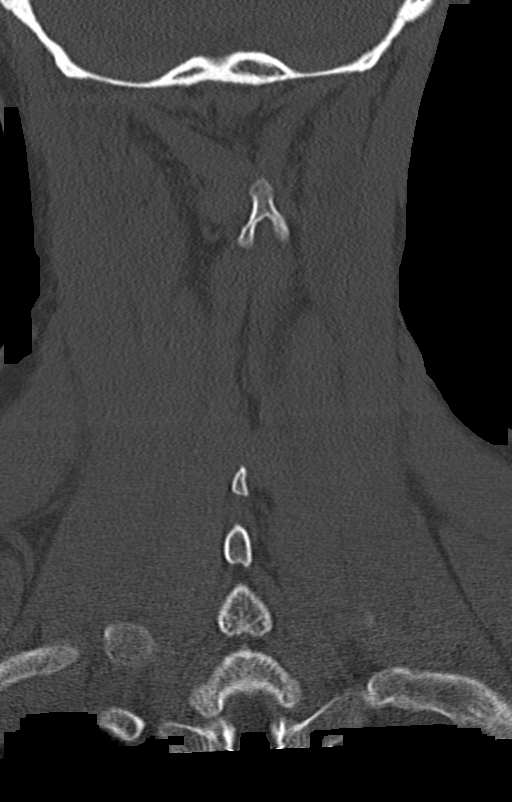

[Series 7: orthogonal axials · axial · 0.21mm/px · z∈[-202,-85]mm · 5 of 92 slices shown, 7 images]
[im 16/92  soft-tissue]
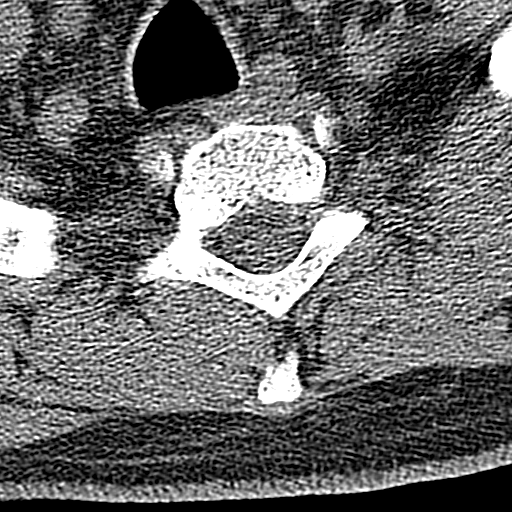
[im 16/92  bone]
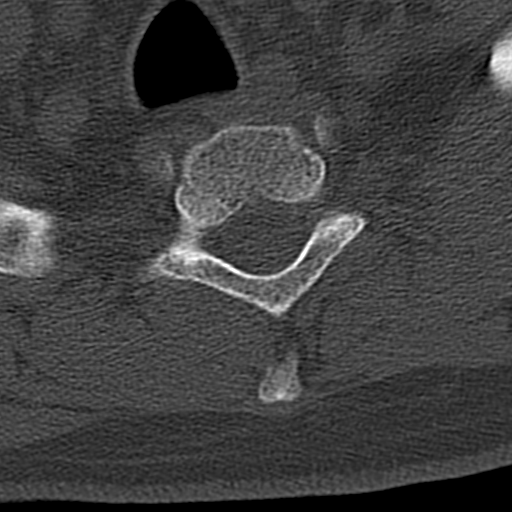
[im 31/92  bone]
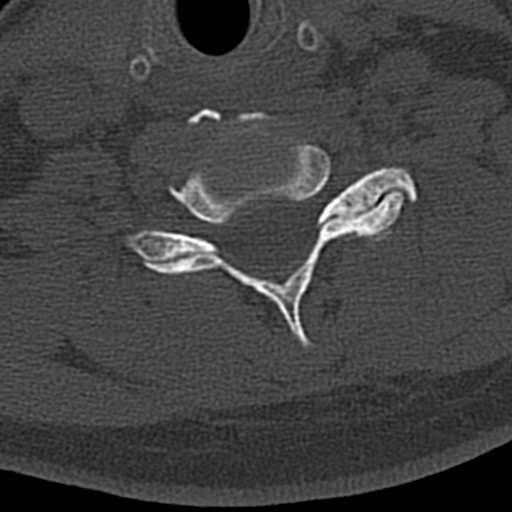
[im 46/92  bone]
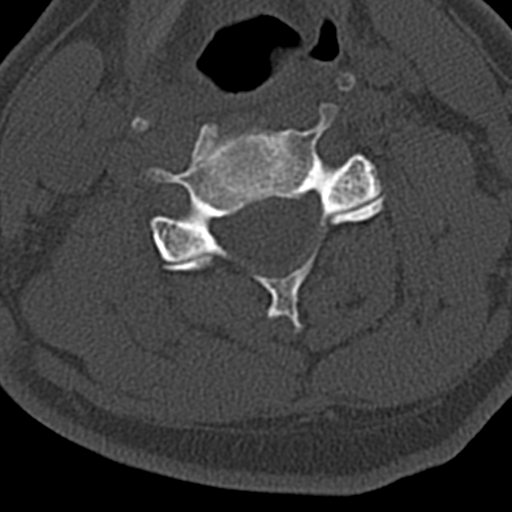
[im 61/92  bone]
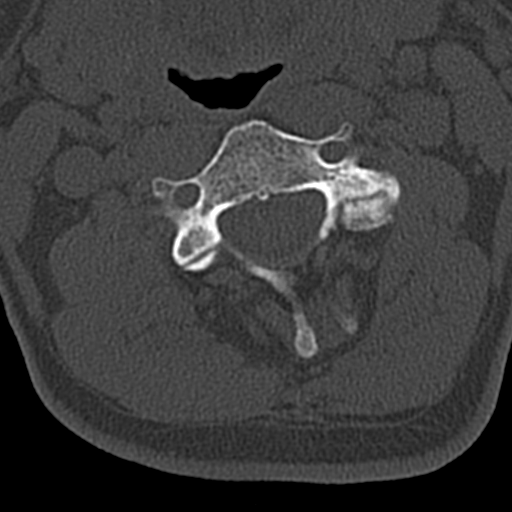
[im 76/92  soft-tissue]
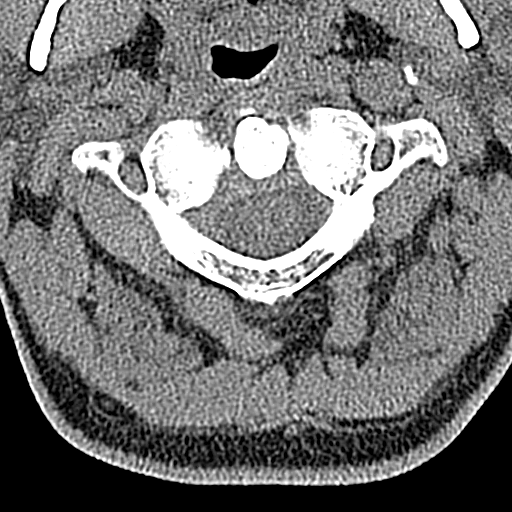
[im 76/92  bone]
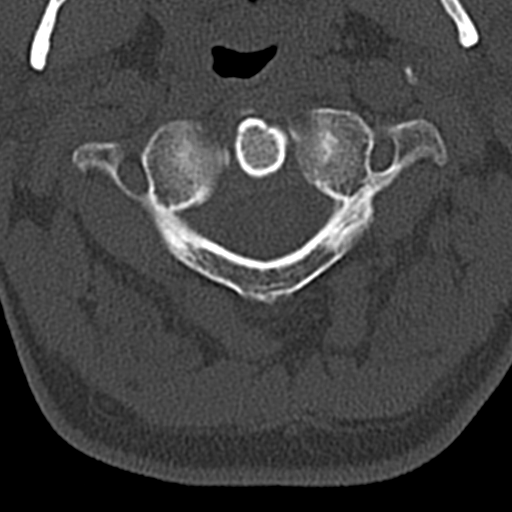

[13 of 33 positions shown; findings below may reference images not displayed]

FINDINGS: Alignment: Straightening of the cervical spine. No facet
subluxation. Dens is well positioned between the lateral masses of
C1.

Skull base and vertebrae: No acute fracture. No primary bone lesion
or focal pathologic process.

Soft tissues and spinal canal: No prevertebral edema. No visible
canal hematoma.

Disc levels: Mild multilevel cervical degenerative disc disease,
most prominent at C5-6. Mild-to-moderate facet arthropathy,
asymmetric to the left. No significant degenerative foraminal
stenosis.

Upper chest: No acute abnormality.

Other: Visualized mastoid air cells appear clear. No discrete
thyroid nodules. No pathologically enlarged cervical nodes.
IMPRESSION: 1. No cervical spine fracture or subluxation.
2. Mild-to-moderate multilevel cervical degenerative disc disease
and facet arthropathy.

## 2022-12-31 IMAGING — CT CT HEAD W/O CM
4 series · 17 of 47 positions shown, 19 images · non-contrast
Comparison: 04/10/2021 head CT.

CLINICAL DATA: Headache, new or worsening (Age >= 50y). Recurrent
seizures reported this morning.



[Series 2: head w o · axial · 0.45mm/px · z∈[-17,+103]mm · 7 of 32 slices shown, 9 images]
[im 4/32  brain]
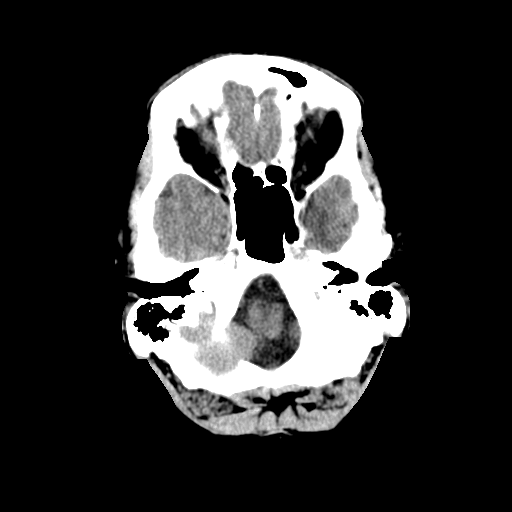
[im 4/32  bone]
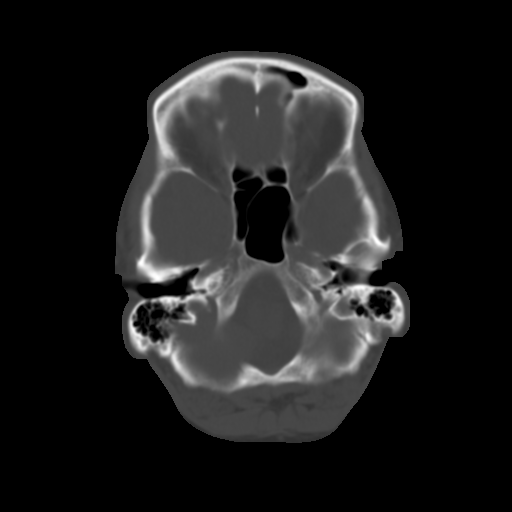
[im 8/32  brain]
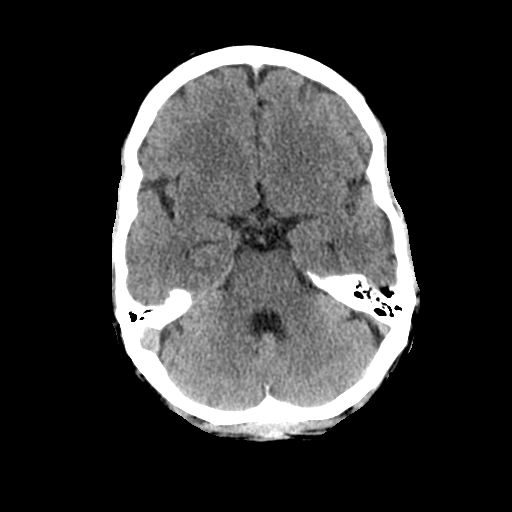
[im 12/32  brain]
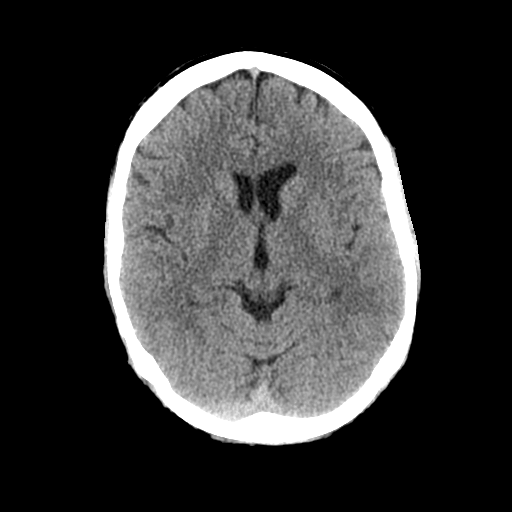
[im 16/32  brain]
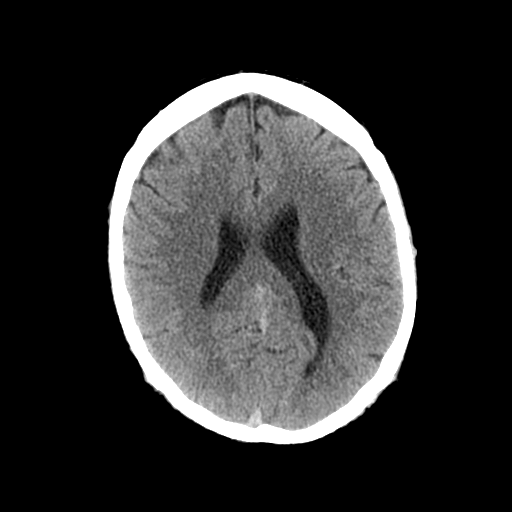
[im 20/32  brain]
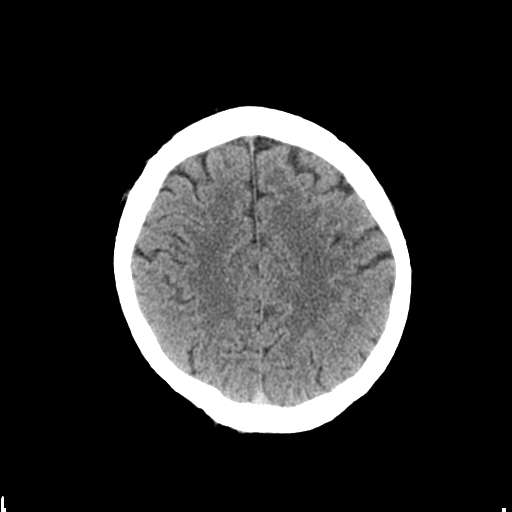
[im 20/32  bone]
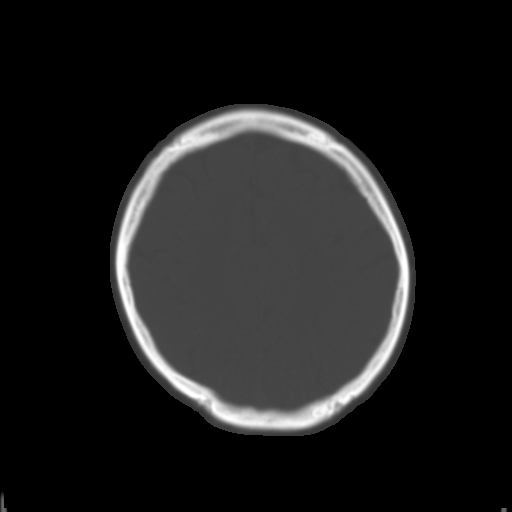
[im 24/32  brain]
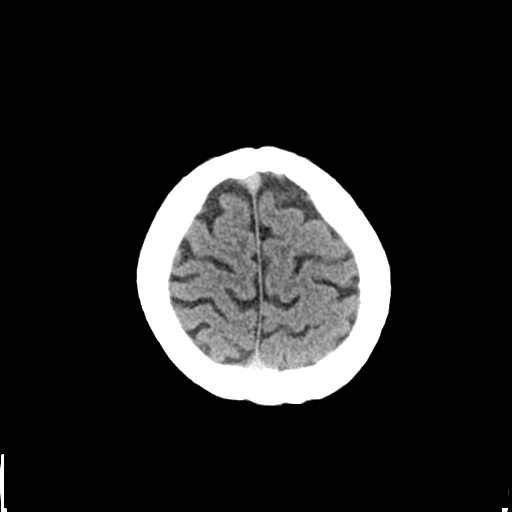
[im 28/32  brain]
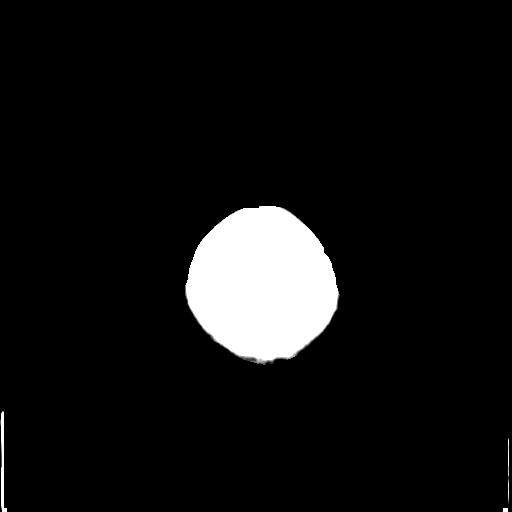

[Series 3: head bone · axial · 0.45mm/px · z∈[-18,+38]mm · 4 of 79 slices shown]
[im 8/79  bone]
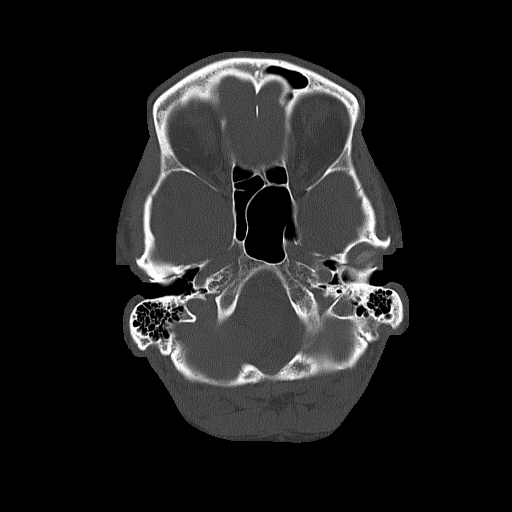
[im 16/79  bone]
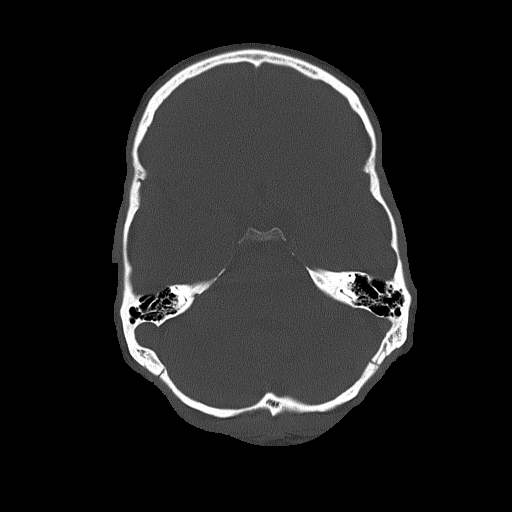
[im 24/79  bone]
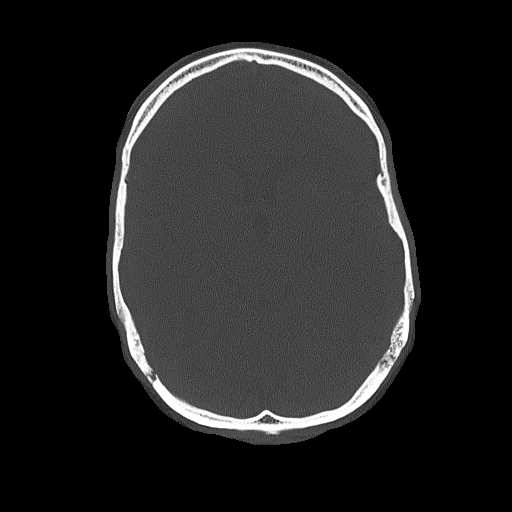
[im 36/79  bone]
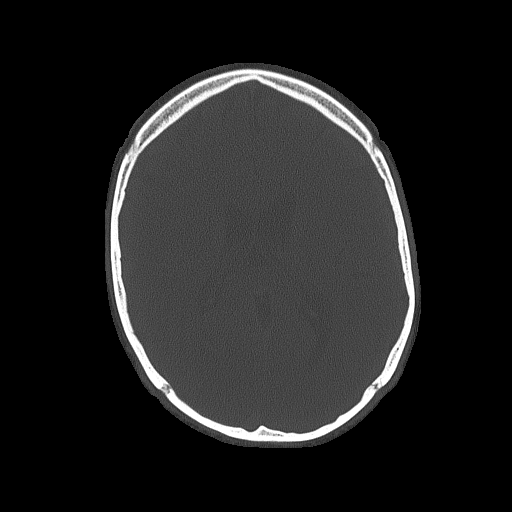

[Series 4: coronal soft · coronal · 0.32mm/px · 3 of 68 slices shown]
[im 23/68  brain]
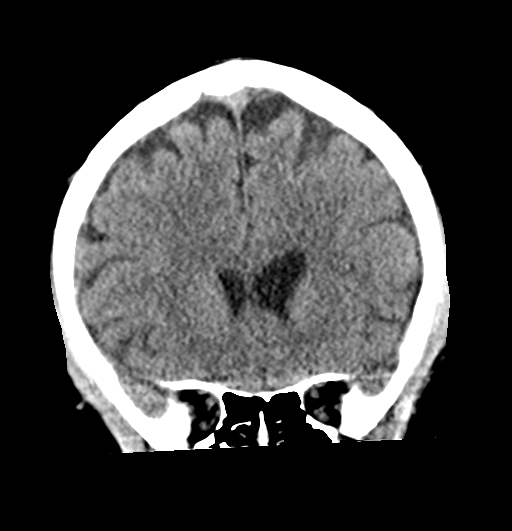
[im 30/68  brain]
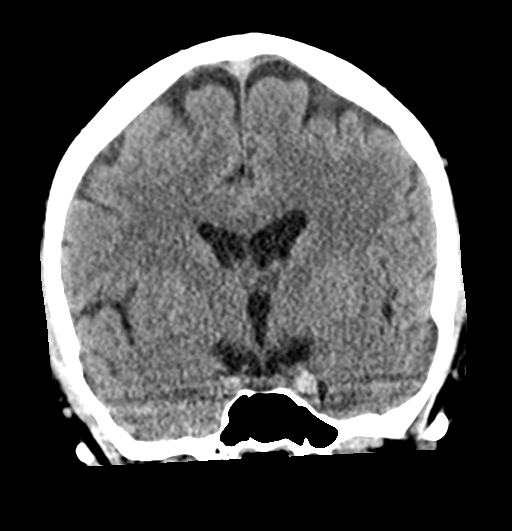
[im 38/68  brain]
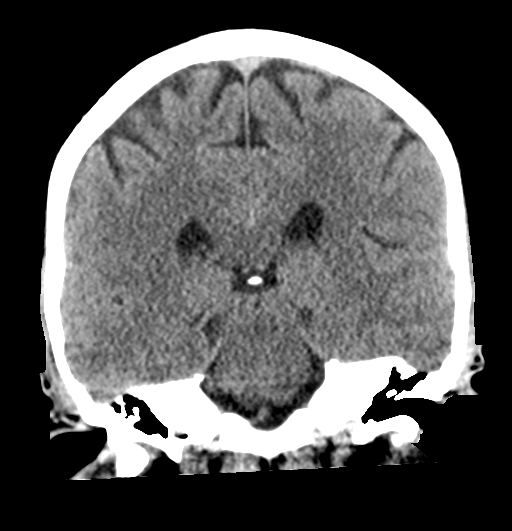

[Series 5: sagittal soft · sagittal · 0.34mm/px · 3 of 56 slices shown]
[im 19/56  brain]
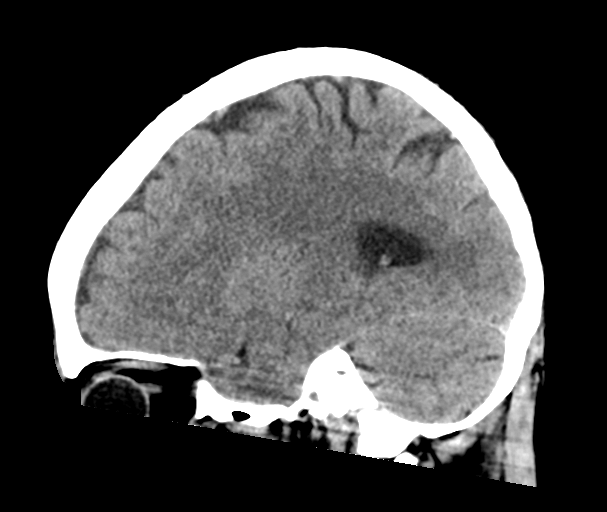
[im 28/56  brain]
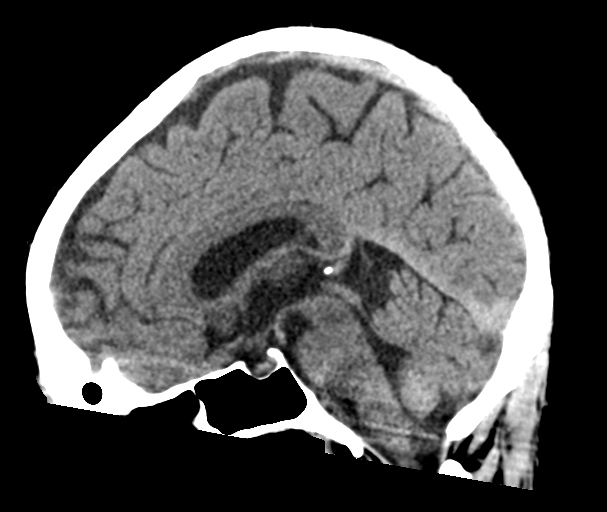
[im 37/56  brain]
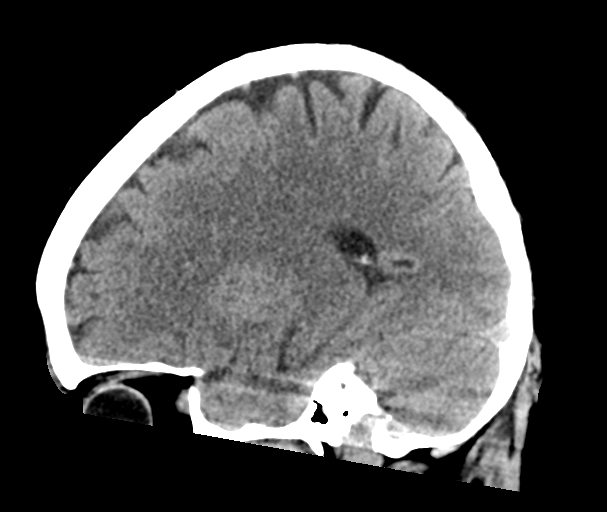

[17 of 47 positions shown; findings below may reference images not displayed]

FINDINGS: Brain: No evidence of parenchymal hemorrhage or extra-axial fluid
collection. No mass lesion, mass effect, or midline shift. No CT
evidence of acute infarction. Cerebral volume is age appropriate. No
ventriculomegaly.

Vascular: No acute abnormality.

Skull: No evidence of calvarial fracture.

Sinuses/Orbits: The visualized paranasal sinuses are essentially
clear.

Other:  The mastoid air cells are unopacified.
IMPRESSION: Negative head CT. No evidence of acute intracranial abnormality.
